# Patient Record
Sex: Male | Born: 1985 | Race: White | Hispanic: No | Marital: Single | Smoking: Never smoker
Health system: Southern US, Community
[De-identification: ages and names within clinical notes are randomized; demographics above are authoritative.]

## PROBLEM LIST (undated history)

## (undated) DIAGNOSIS — F419 Anxiety disorder, unspecified: Secondary | ICD-10-CM

---

## 2014-02-01 ENCOUNTER — Emergency Department: Payer: Self-pay | Admitting: Emergency Medicine

## 2015-10-03 ENCOUNTER — Encounter (HOSPITAL_COMMUNITY): Payer: Self-pay | Admitting: Emergency Medicine

## 2015-10-03 DIAGNOSIS — M25472 Effusion, left ankle: Secondary | ICD-10-CM | POA: Insufficient documentation

## 2015-10-03 DIAGNOSIS — Z79899 Other long term (current) drug therapy: Secondary | ICD-10-CM | POA: Insufficient documentation

## 2015-10-03 NOTE — ED Notes (Signed)
Pt. reports left ankle pain with swelling onset 2 days ago , denies recent injury or fall /ambulatory .

## 2015-10-04 ENCOUNTER — Emergency Department (HOSPITAL_COMMUNITY)
Admission: EM | Admit: 2015-10-04 | Discharge: 2015-10-04 | Disposition: A | Discharge status: Home / Self Care | Payer: Self-pay | Attending: Emergency Medicine | Admitting: Emergency Medicine

## 2015-10-04 ENCOUNTER — Emergency Department (HOSPITAL_COMMUNITY): Payer: Self-pay

## 2015-10-04 DIAGNOSIS — M25472 Effusion, left ankle: Secondary | ICD-10-CM

## 2015-10-04 MED ORDER — OXYCODONE-ACETAMINOPHEN 5-325 MG PO TABS
1.0000 | ORAL_TABLET | Freq: Once | ORAL | Status: AC
  Administered 2015-10-04: 1 via ORAL
  Filled 2015-10-04: qty 1

## 2015-10-04 MED ORDER — OXYCODONE-ACETAMINOPHEN 5-325 MG PO TABS: 1.0000 | ORAL_TABLET | ORAL | Status: AC | PRN

## 2015-10-04 NOTE — Discharge Instructions (Signed)
Ankle Sprain  An ankle sprain is an injury to the strong, fibrous tissues (ligaments) that hold the bones of your ankle joint together.   CAUSES  An ankle sprain is usually caused by a fall or by twisting your ankle. Ankle sprains most commonly occur when you step on the outer edge of your foot, and your ankle turns inward. People who participate in sports are more prone to these types of injuries.   SYMPTOMS    Pain in your ankle. The pain may be present at rest or only when you are trying to stand or walk.   Swelling.   Bruising. Bruising may develop immediately or within 1 to 2 days after your injury.   Difficulty standing or walking, particularly when turning corners or changing directions.  DIAGNOSIS   Your caregiver will ask you details about your injury and perform a physical exam of your ankle to determine if you have an ankle sprain. During the physical exam, your caregiver will press on and apply pressure to specific areas of your foot and ankle. Your caregiver will try to move your ankle in certain ways. An X-ray exam may be done to be sure a bone was not broken or a ligament did not separate from one of the bones in your ankle (avulsion fracture).   TREATMENT   Certain types of braces can help stabilize your ankle. Your caregiver can make a recommendation for this. Your caregiver may recommend the use of medicine for pain. If your sprain is severe, your caregiver may refer you to a surgeon who helps to restore function to parts of your skeletal system (orthopedist) or a physical therapist.  HOME CARE INSTRUCTIONS    Apply ice to your injury for 1-2 days or as directed by your caregiver. Applying ice helps to reduce inflammation and pain.    Put ice in a plastic bag.    Place a towel between your skin and the bag.    Leave the ice on for 15-20 minutes at a time, every 2 hours while you are awake.   Only take over-the-counter or prescription medicines for pain, discomfort, or fever as directed by  your caregiver.   Elevate your injured ankle above the level of your heart as much as possible for 2-3 days.   If your caregiver recommends crutches, use them as instructed. Gradually put weight on the affected ankle. Continue to use crutches or a cane until you can walk without feeling pain in your ankle.   If you have a plaster splint, wear the splint as directed by your caregiver. Do not rest it on anything harder than a pillow for the first 24 hours. Do not put weight on it. Do not get it wet. You may take it off to take a shower or bath.   You may have been given an elastic bandage to wear around your ankle to provide support. If the elastic bandage is too tight (you have numbness or tingling in your foot or your foot becomes cold and blue), adjust the bandage to make it comfortable.   If you have an air splint, you may blow more air into it or let air out to make it more comfortable. You may take your splint off at night and before taking a shower or bath. Wiggle your toes in the splint several times per day to decrease swelling.  SEEK MEDICAL CARE IF:    You have rapidly increasing bruising or swelling.   Your toes feel   extremely cold or you lose feeling in your foot.   Your pain is not relieved with medicine.  SEEK IMMEDIATE MEDICAL CARE IF:   Your toes are numb or blue.   You have severe pain that is increasing.  MAKE SURE YOU:    Understand these instructions.   Will watch your condition.   Will get help right away if you are not doing well or get worse.     This information is not intended to replace advice given to you by your health care provider. Make sure you discuss any questions you have with your health care provider.     Document Released: 03/21/2005 Document Revised: 04/11/2014 Document Reviewed: 04/02/2011  Elsevier Interactive Patient Education 2016 [employer]vier [employer]

## 2015-10-04 NOTE — ED Notes (Signed)
Pt left at this time with all belongings, refused wheelchair.  

## 2015-10-04 NOTE — ED Provider Notes (Signed)
CSN: [id number]     Arrival date & time 10/03/15  2335 History   First MD Initiated Contact with Patient 10/04/15 0242     Chief Complaint  Patient presents with  . Ankle Pain     (Consider location/radiation/quality/duration/timing/severity/associated sxs/prior Treatment) HPI   PCP: No primary care provider on file. Vincent Johnston male29 y.o. PMH: chronic ankle (left) injury  Patient to the ER for xray and pain medication for his left ankle. He reports injuring it significantly many years ago and that every once in a while he re- injures it and experiences acute pain. He believes he may have slept on it wrong because when he got out of bed this morning it gave out on him. 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   ROS: The patient denies confusion, diaphoresis,  headache, weakness (general or focal), confusion, change of vision, rash, neck pain, chest pain, shortness of breath,  back pain.     History reviewed. No pertinent past medical history. History reviewed. No pertinent past surgical history. No family history on file. Social History  Substance Use Topics  . Smoking status: Never Smoker   . Smokeless tobacco: None  . Alcohol Use: Yes    Review of Systems  Review of Systems All other systems negative except as documented in the HPI. All pertinent positives and negatives as reviewed in the HPI.   Allergies  Cefaclor; Cephalexin; Cephalosporins; Loracarbef; and Penicillins  Home Medications   Prior to Admission medications   Medication Sig Start Date End Date Taking? Authorizing Provider  clonazePAM (KLONOPIN) 1 MG tablet Take 0.5 mg by mouth 2 (two) times daily. 08/09/15  Yes Historical Provider, MD  ibuprofen (ADVIL,MOTRIN) 800 MG tablet Take 800 mg by mouth every 6 (six) hours as needed for moderate pain.   Yes Historical Provider, MD  PARoxetine (PAXIL) 20 MG tablet Take 20 mg by mouth daily. 07/07/15  Yes Historical  Provider, MD  prednisoLONE (ORAPRED) 15 MG/5ML solution Take 20 mg by mouth daily. 10/02/15  Yes Historical Provider, MD  oxyCODONE-acetaminophen (PERCOCET/ROXICET) 5-325 MG tablet Take 1 tablet by mouth every 4 (four) hours as needed for severe pain. 10/04/15   Vincent Omar Neva SeatGreene, PA-C   BP 138/106 mmHg  Pulse 73  Temp(Src) 98.5 F (36.9 C) (Oral)  Resp 16  Ht 5\' 10"  (1.778 m)  Wt 96.163 kg  BMI 30.42 kg/m2  SpO2 100% Physical Exam  Constitutional: He appears well-developed and well-nourished. No distress.  HENT:  Head: Normocephalic and atraumatic.  Eyes: Pupils are equal, round, and reactive to light.  Neck: Normal range of motion. Neck supple.  Cardiovascular: Normal rate and regular rhythm.   Pulmonary/Chest: Effort normal.  Abdominal: Soft.  Musculoskeletal:       Left ankle: He exhibits decreased range of motion (due to pain) and swelling. He exhibits no ecchymosis, no deformity, no laceration and normal pulse. Tenderness. Lateral malleolus and medial malleolus tenderness found. No AITFL tenderness found. Achilles tendon normal.  Neurological: He is alert.  Skin: Skin is warm and dry.  Nursing note and vitals reviewed.   ED Course  Procedures (including critical care time) Labs Review Labs Reviewed - No data to display  Imaging Review Dg Ankle Complete Left  10/04/2015  CLINICAL DATA:  Initial evaluation for acute ankle pain. EXAM: LEFT ANKLE COMPLETE - 3+ VIEW COMPARISON:  None. FINDINGS: No acute fracture or dislocation. Ankle mortise approximated. Talar dome intact. Probable small joint  effusion with mild soft tissue swelling. Osseous mineralization normal. IMPRESSION: 1. No acute fracture or dislocation. 2. Probable small joint effusion with mild diffuse soft tissue swelling. Electronically Signed   By: Rise MuBenjamin  McClintock M.D.   On: 10/04/2015 04:42   I have personally reviewed and evaluated these images and lab results as part of my medical decision-making.   EKG  Interpretation None      MDM   Final diagnoses:  Ankle effusion, left    Patient X-Ray negative for obvious fracture or dislocation.  Pt advised to follow up with orthopedics. Patient has brace at home already.  Conservative therapy recommended and discussed, given referral to Ortho. Patient will be discharged home & is agreeable with above plan. Returns precautions discussed. Pt appears safe for discharge.     Vincent Peliffany Norinne Jeane, PA-C 10/04/15 0510  Rolland PorterMark James, MD 10/16/15 [phone]

## 2015-10-15 ENCOUNTER — Ambulatory Visit (HOSPITAL_COMMUNITY)
Admission: EM | Admit: 2015-10-15 | Discharge: 2015-10-15 | Disposition: A | Discharge status: Home / Self Care | Payer: Self-pay | Attending: Family Medicine | Admitting: Family Medicine

## 2015-10-15 ENCOUNTER — Encounter (HOSPITAL_COMMUNITY): Payer: Self-pay | Admitting: *Deleted

## 2015-10-15 DIAGNOSIS — S93402S Sprain of unspecified ligament of left ankle, sequela: Secondary | ICD-10-CM

## 2015-10-15 NOTE — Discharge Instructions (Signed)
Ankle Sprain °An ankle sprain is an injury to the strong, fibrous tissues (ligaments) that hold your ankle bones together.  °HOME CARE  °· Put ice on your ankle for 1-2 days or as told by your doctor. °¨ Put ice in a plastic bag. °¨ Place a towel between your skin and the bag. °¨ Leave the ice on for 15-20 minutes at a time, every 2 hours while you are awake. °· Only take medicine as told by your doctor. °· Raise (elevate) your injured ankle above the level of your heart as much as possible for 2-3 days. °· Use crutches if your doctor tells you to. Slowly put your own weight on the affected ankle. Use the crutches until you can walk without pain. °· If you have a plaster splint: °¨ Do not rest it on anything harder than a pillow for 24 hours. °¨ Do not put weight on it. °¨ Do not get it wet. °¨ Take it off to shower or bathe. °· If given, use an elastic wrap or support stocking for support. Take the wrap off if your toes lose feeling (numb), tingle, or turn cold or blue. °· If you have an air splint: °¨ Add or let out air to make it comfortable. °¨ Take it off at night and to shower and bathe. °¨ Wiggle your toes and move your ankle up and down often while you are wearing it. °GET HELP IF: °· You have rapidly increasing bruising or puffiness (swelling). °· Your toes feel very cold. °· You lose feeling in your foot. °· Your medicine does not help your pain. °GET HELP RIGHT AWAY IF:  °· Your toes lose feeling (numb) or turn blue. °· You have severe pain that is increasing. °MAKE SURE YOU:  °· Understand these instructions. °· Will watch your condition. °· Will get help right away if you are not doing well or get worse. °  °This information is not intended to replace advice given to you by your health care provider. Make sure you discuss any questions you have with your health care provider. °  °Document Released: 09/07/2007 Document Revised: 04/11/2014 Document Reviewed: 10/03/2011 °Elsevier Interactive Patient  Education ©2016 Elsevier Inc. ° °

## 2015-10-15 NOTE — ED Provider Notes (Signed)
CSN: [id number]     Arrival date & time 10/15/15  1113 History   First MD Initiated Contact with Patient 10/15/15 1215     Chief Complaint  Patient presents with  . Ankle Pain   (Consider location/radiation/quality/duration/timing/severity/associated sxs/prior Treatment) HPI History obtained from patient:  Pt presents with the cc of:  Left ankle pain Duration of symptoms: 10-12 days Treatment prior to arrival: Was seen in the emergency department had x-rays done no fracture was seen and treated for ankle sprain. Context: Patient states that he sprained his left ankle getting out of bed on July 2, was seen in the emergency department had x-rays done and is been treating the ankle symptomatically states that there was a period of time it if felt better and then over the last day or so pain is gotten worse. He is requesting pain medicine at this time also. States that he tried to make an appointment with Dr. Magnus Ivan but was unable to do so on until later in July. Other symptoms include: Pain in standing and walking on left ankle foot Pain score: 4 or 5 FAMILY HISTORY: None    History reviewed. No pertinent past medical history. History reviewed. No pertinent past surgical history. History reviewed. No pertinent family history. Social History  Substance Use Topics  . Smoking status: Never Smoker   . Smokeless tobacco: None  . Alcohol Use: Yes    Review of Systems  Denies: HEADACHE, NAUSEA, ABDOMINAL PAIN, CHEST PAIN, CONGESTION, DYSURIA, SHORTNESS OF BREATH  Allergies  Cefaclor; Cephalexin; Cephalosporins; Loracarbef; and Penicillins  Home Medications   Prior to Admission medications   Medication Sig Start Date End Date Taking? Authorizing Provider  clonazePAM (KLONOPIN) 1 MG tablet Take 0.5 mg by mouth 2 (two) times daily. 08/09/15   Historical Provider, MD  ibuprofen (ADVIL,MOTRIN) 800 MG tablet Take 800 mg by mouth every 6 (six) hours as needed for moderate pain.    Historical  Provider, MD  oxyCODONE-acetaminophen (PERCOCET/ROXICET) 5-325 MG tablet Take 1 tablet by mouth every 4 (four) hours as needed for severe pain. 10/04/15   Tiffany Neva Seat, PA-C  PARoxetine (PAXIL) 20 MG tablet Take 20 mg by mouth daily. 07/07/15   Historical Provider, MD  prednisoLONE (ORAPRED) 15 MG/5ML solution Take 20 mg by mouth daily. 10/02/15   Historical Provider, MD   Meds Ordered and Administered this Visit  Medications - No data to display  BP 162/104 mmHg  Pulse 102  Temp(Src) 98.6 F (37 C) (Oral)  Resp 18  SpO2 100% No data found.   Physical Exam NURSES NOTES AND VITAL SIGNS REVIEWED. CONSTITUTIONAL: Well developed, well nourished, no acute distress HEENT: normocephalic, atraumatic EYES: Conjunctiva normal NECK:normal ROM, supple, no adenopathy PULMONARY:No respiratory distress, normal effort ABDOMINAL: Soft, ND, NT BS+, No CVAT MUSCULOSKELETAL: Normal ROM of all extremities, left ankle is without swelling patient states he continues to have tenderness on the medial aspect of the ankle. No bruising is noted. SKIN: warm and dry without rash PSYCHIATRIC: Mood and affect, behavior are normal  ED Course  Procedures (including critical care time)  Labs Review Labs Reviewed - No data to display  Imaging Review No results found.   Visual Acuity Review  Right Eye Distance:   Left Eye Distance:   Bilateral Distance:    Right Eye Near:   Left Eye Near:    Bilateral Near:      Cam Dan Humphreys is applied to the left foot and ankle. He is advised to continue using Tylenol  ibuprofen for discomfort.   MDM   1. Ankle sprain, left, sequela     Patient is reassured that there are no issues that require transfer to higher level of care at this time or additional tests. Patient is advised to continue home symptomatic treatment. Patient is advised that if there are new or worsening symptoms to attend the emergency department, contact primary care provider, or return to  [facility]. Instructions of care provided discharged home in stable condition.    THIS NOTE WAS GENERATED USING A VOICE RECOGNITION SOFTWARE PROGRAM. ALL REASONABLE EFFORTS  WERE MADE TO PROOFREAD THIS DOCUMENT FOR ACCURACY.  I have verbally reviewed the discharge instructions with the patient. A printed AVS was given to the patient.  All questions were answered prior to discharge.      Tharon AquasFrank C Naamah Boggess, PA 10/15/15 1354

## 2015-10-15 NOTE — ED Notes (Signed)
Pt  Noticed  On his   Discharge  Papers  An  Entry  On his  Chart   From a  Prior  Visit  Stating  He  Has  Taken  augmentin in past  But  Pt  States  He  Has  Not  And  Is  Unsure  Why it  Was  Charted

## 2015-10-15 NOTE — ED Notes (Signed)
Pt  Was  Seen    11  Days  Ago   At the    Er    For  An  Ankle  Sprain   He  Is  Here  For  followup   As  It  Had  Gotten  Better  But  Became  Worse      Over  last  Several days    He  Says  He  Stands  On his  Feet   At work  A  Lot

## 2017-05-28 IMAGING — CR DG ANKLE COMPLETE 3+V*L*
3 series · 3 of 3 positions shown · non-contrast
Comparison: None.

CLINICAL DATA: Initial evaluation for acute ankle pain.

EXAM:
LEFT ANKLE COMPLETE - 3+ VIEW

[ankle ap]
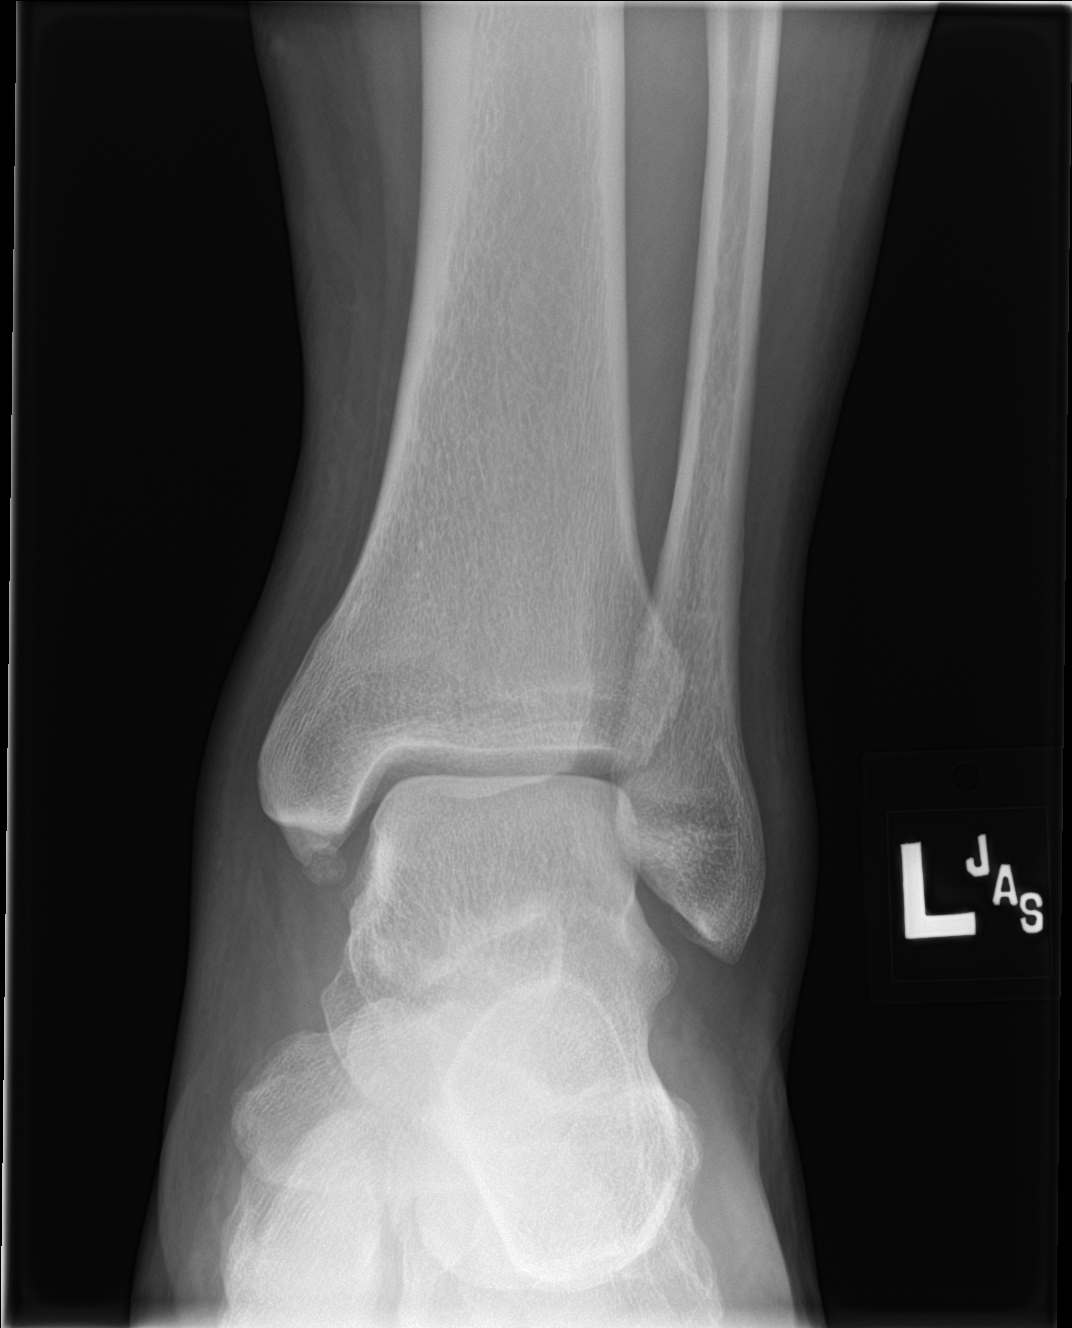

[ankle obl]
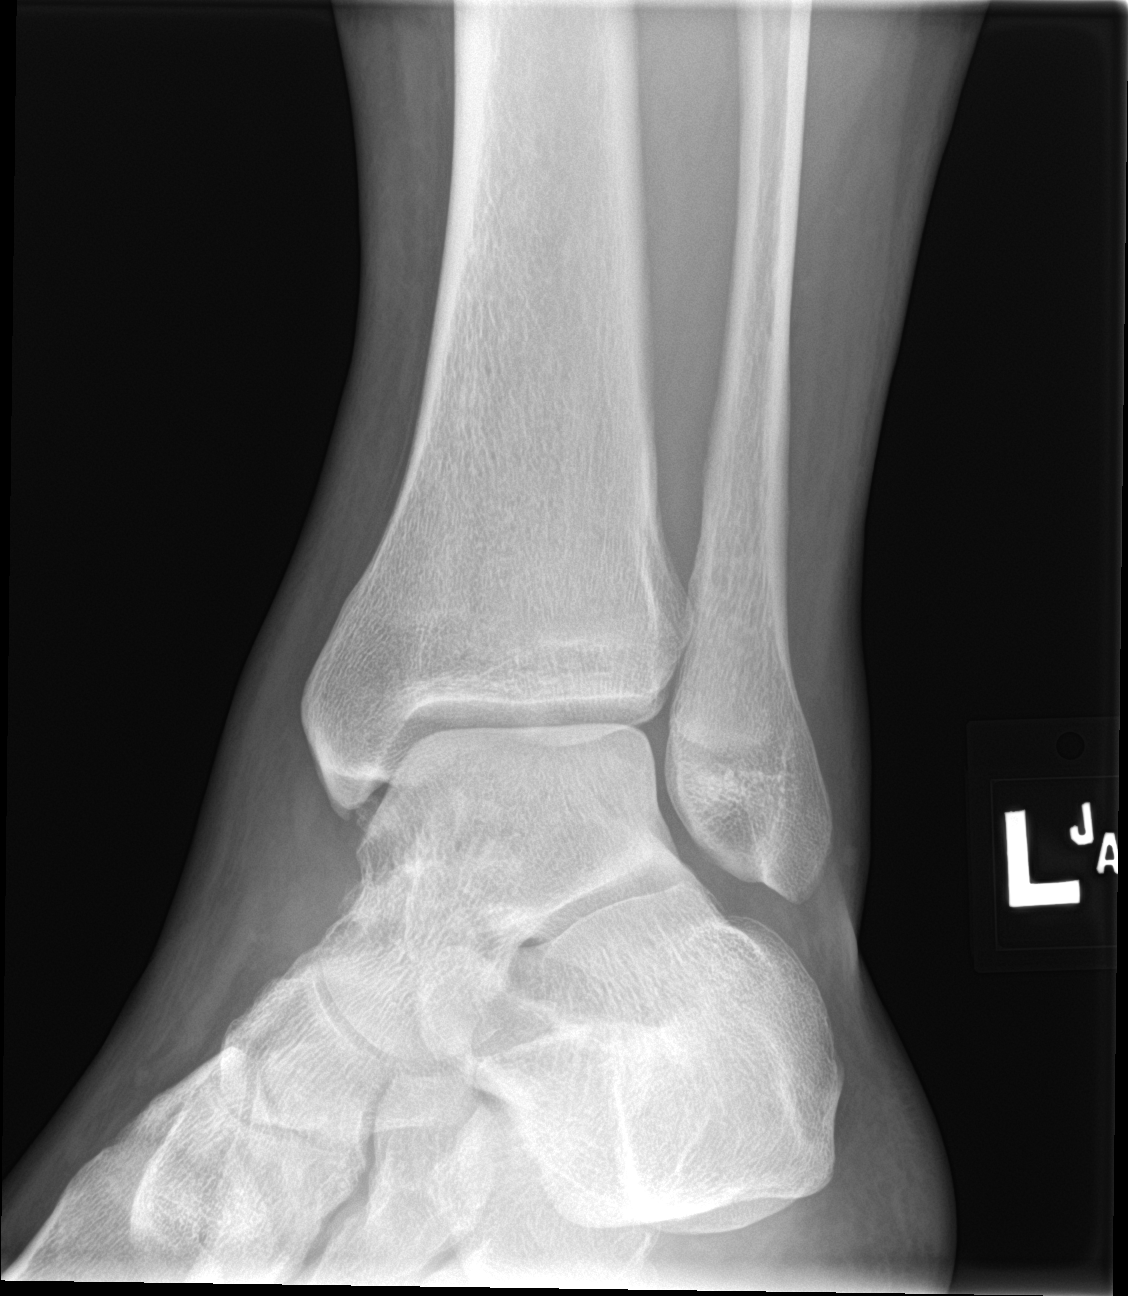

[ankle lat]
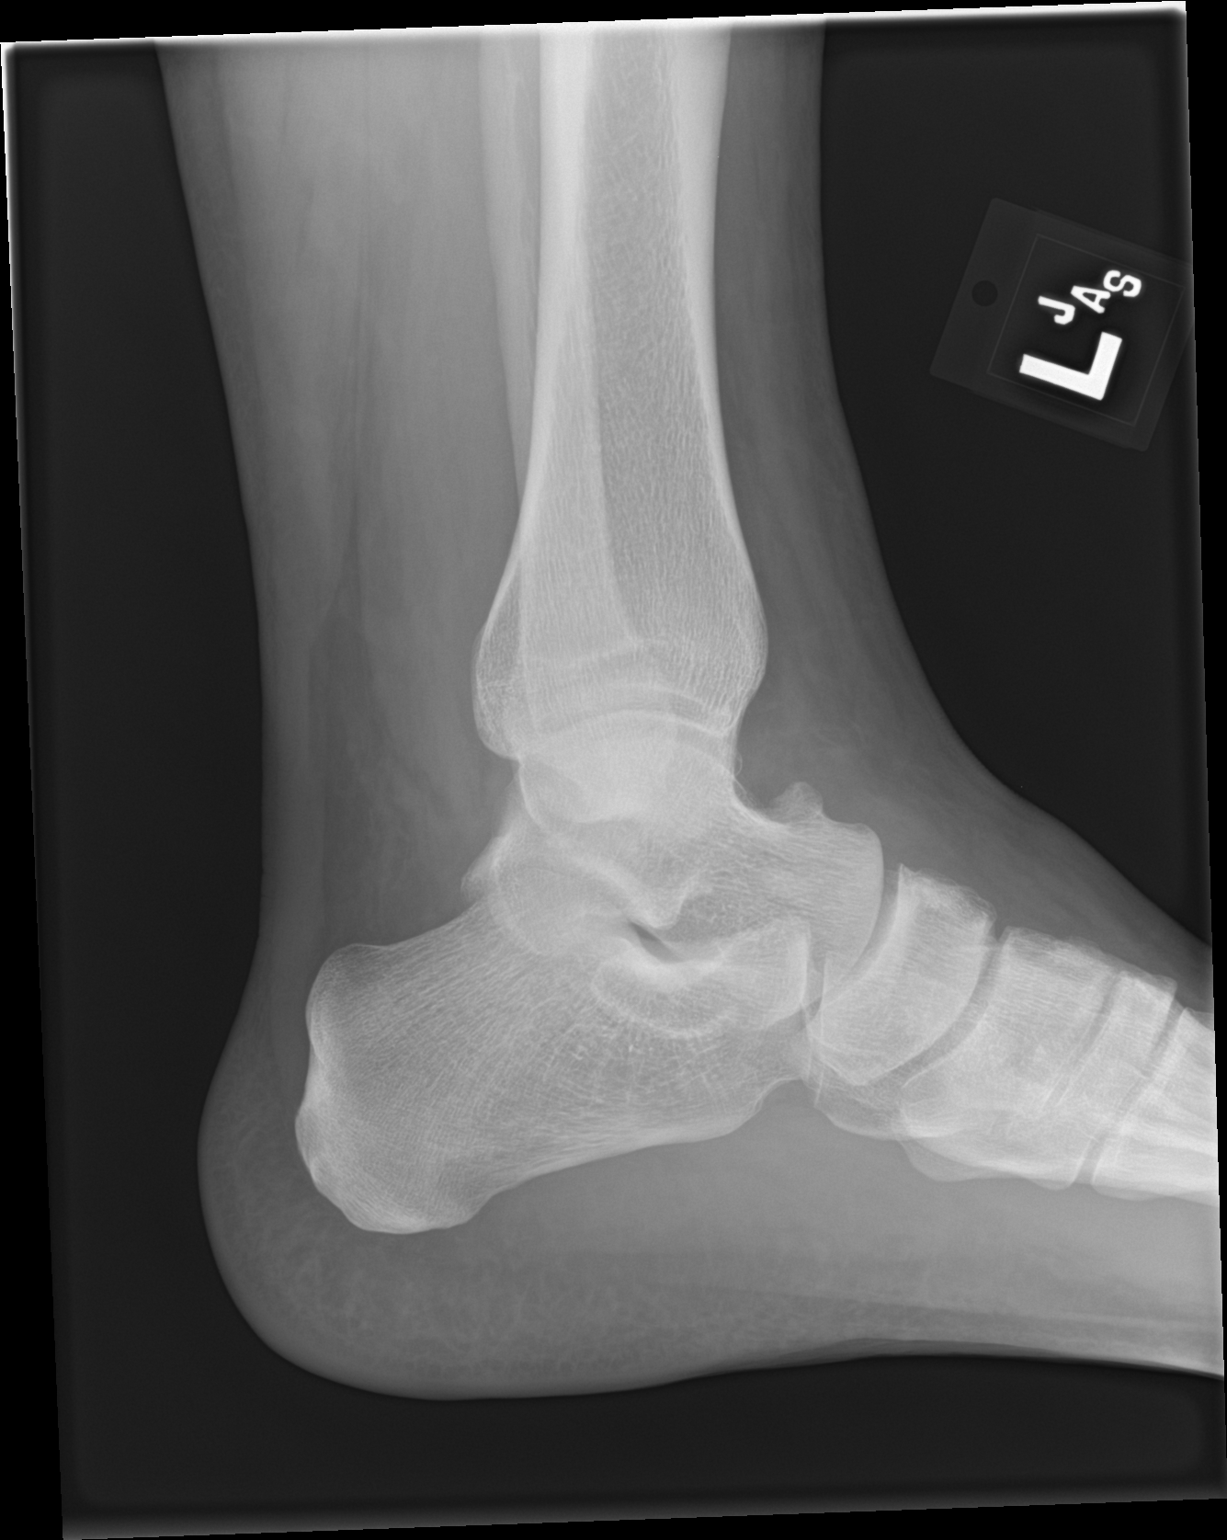

[3 of 3 positions shown; findings below may reference images not displayed]

FINDINGS: No acute fracture or dislocation. Ankle mortise approximated. Talar
dome intact. Probable small joint effusion with mild soft tissue
swelling. Osseous mineralization normal.
IMPRESSION: 1. No acute fracture or dislocation.
2. Probable small joint effusion with mild diffuse soft tissue
swelling.

## 2017-09-28 ENCOUNTER — Other Ambulatory Visit: Payer: Self-pay | Admitting: *Deleted

## 2017-09-28 ENCOUNTER — Ambulatory Visit
Admission: RE | Admit: 2017-09-28 | Discharge: 2017-09-28 | Disposition: A | Discharge status: Home / Self Care | Payer: No Typology Code available for payment source | Source: Ambulatory Visit | Attending: *Deleted | Admitting: *Deleted

## 2017-09-28 DIAGNOSIS — R7611 Nonspecific reaction to tuberculin skin test without active tuberculosis: Secondary | ICD-10-CM

## 2021-08-31 ENCOUNTER — Other Ambulatory Visit: Payer: Self-pay

## 2021-08-31 ENCOUNTER — Encounter: Payer: Self-pay | Admitting: Emergency Medicine

## 2021-08-31 ENCOUNTER — Ambulatory Visit
Admission: EM | Admit: 2021-08-31 | Discharge: 2021-08-31 | Disposition: A | Discharge status: Home / Self Care | Payer: 59 | Attending: Internal Medicine | Admitting: Internal Medicine

## 2021-08-31 ENCOUNTER — Emergency Department (HOSPITAL_COMMUNITY)
Admission: EM | Admit: 2021-08-31 | Discharge: 2021-08-31 | Discharge status: Against Medical Advice | Payer: No Typology Code available for payment source

## 2021-08-31 DIAGNOSIS — F410 Panic disorder [episodic paroxysmal anxiety] without agoraphobia: Secondary | ICD-10-CM | POA: Diagnosis not present

## 2021-08-31 DIAGNOSIS — R0789 Other chest pain: Secondary | ICD-10-CM | POA: Diagnosis not present

## 2021-08-31 HISTORY — DX: Anxiety disorder, unspecified: F41.9

## 2021-08-31 NOTE — ED Notes (Signed)
Patient left without being seen, states I am becoming more anxious while waiting and I am afraid I will have another panic attack.

## 2021-08-31 NOTE — Discharge Instructions (Addendum)
Please go to the ER if symptoms persist or worsen. Please go to provided address for behavioral health urgent care for further evaluation and management of anxiety.  Also recommend getting a primary care doctor for further evaluation management.

## 2021-08-31 NOTE — ED Triage Notes (Signed)
Pt here for episode of CP today that felt like anxiety with hx of same; pt went to ED but LWBS

## 2021-08-31 NOTE — ED Provider Notes (Signed)
[facility]    CSN: [id number] Arrival date & time: 08/31/21  1652      History   Chief Complaint Chief Complaint  Patient presents with   Anxiety   Chest Pain    HPI Vincent Johnston is a 36 y.o. male.   Patient presents for suspected anxiety panic attack that started at approximately 3 PM today.  Patient reports that he started feeling anxious, so he took his blood pressure which was 180 systolic.  This made him feel more anxious so he started having chest pain.  Due to this, he went to the emergency department to ensure that everything was okay and his heart was okay.  Patient left before being seen.  Patient presents to the urgent care today to have an EKG completed to ensure that his heart is okay.  He reports that his panic attack has basically resolved and is not having any chest pain at this time.  Denies any associated shortness of breath at this time as well.  Patient reports that he has been dealing with panic attacks for multiple years.  He took Klonopin as needed in the past as well as Paxil.  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  He currently takes Paxil 40 mg daily which is managed by a family member who is a Publishing rights manager.  He does not have a PCP.  He does attend therapy weekly as well.  Patient reports that he has "left ventricular problems" that was diagnosed approximately 15 years ago that was no concern at that time.  Denies any other pertinent cardiac history.  Patient used to have high blood pressure as well but does not currently take any blood pressure medication.   Anxiety  Chest Pain Associated symptoms: anxiety    Past Medical History:  Diagnosis Date   Anxiety     There are no problems to display for this patient.   History reviewed. No pertinent surgical history.     Home Medications    Prior to Admission medications   Medication Sig Start Date End Date  Taking? Authorizing Provider  clonazePAM (KLONOPIN) 1 MG tablet Take 0.5 mg by mouth 2 (two) times daily. 08/09/15   [provider]  ibuprofen (ADVIL,MOTRIN) 800 MG tablet Take 800 mg by mouth every 6 (six) hours as needed for moderate pain.    [provider]  oxyCODONE-acetaminophen (PERCOCET/ROXICET) 5-325 MG tablet Take 1 tablet by mouth every 4 (four) hours as needed for severe pain. 10/04/15   Marlon Pel, PA-C  PARoxetine (PAXIL) 20 MG tablet Take 20 mg by mouth daily. 07/07/15   [provider]  prednisoLONE (ORAPRED) 15 MG/5ML solution Take 20 mg by mouth daily. Patient not taking: Reported on 08/31/2021 10/02/15   [provider]    Family History History reviewed. No pertinent family history.  Social History Social History   Tobacco Use   Smoking status: Never  Substance Use Topics   Alcohol use: Yes   Drug use: No     Allergies   Cefaclor, Cephalexin, Cephalosporins, Loracarbef, and Penicillins   Review of Systems Review of Systems Per HPI  Physical Exam Triage Vital Signs ED Triage Vitals [08/31/21 1813]  Enc Vitals Group     BP (!) 155/111     Pulse Rate 92     Resp 18     Temp 98.2 F (36.8 C)     Temp Source Oral  SpO2 96 %     Weight      Height      Head Circumference      Peak Flow      Pain Score 3     Pain Loc      Pain Edu?      Excl. in GC?    No data found.  Updated Vital Signs BP (!) 155/111 (BP Location: Left Arm)   Pulse 92   Temp 98.2 F (36.8 C) (Oral)   Resp 18   SpO2 96%   Visual Acuity Right Eye Distance:   Left Eye Distance:   Bilateral Distance:    Right Eye Near:   Left Eye Near:    Bilateral Near:     Physical Exam Constitutional:      General: He is not in acute distress.    Appearance: Normal appearance. He is not toxic-appearing or diaphoretic.  HENT:     Head: Normocephalic and atraumatic.  Eyes:     Extraocular Movements: Extraocular movements intact.      Conjunctiva/sclera: Conjunctivae normal.     Pupils: Pupils are equal, round, and reactive to light.  Cardiovascular:     Rate and Rhythm: Normal rate and regular rhythm.     Pulses: Normal pulses.     Heart sounds: Normal heart sounds.  Pulmonary:     Effort: Pulmonary effort is normal.     Breath sounds: Normal breath sounds.  Neurological:     General: No focal deficit present.     Mental Status: He is alert and oriented to person, place, and time. Mental status is at baseline.     Cranial Nerves: Cranial nerves 2-12 are intact.     Sensory: Sensation is intact.     Motor: Motor function is intact.     Coordination: Coordination is intact.     Gait: Gait is intact.  Psychiatric:        Mood and Affect: Mood normal.        Behavior: Behavior normal.        Thought Content: Thought content normal.        Judgment: Judgment normal.     UC Treatments / Results  Labs (all labs ordered are listed, but only abnormal results are displayed) Labs Reviewed - No data to display  EKG   Radiology No results found.  Procedures Procedures (including critical care time)  Medications Ordered in UC Medications - No data to display  Initial Impression / Assessment and Plan / UC Course  I have reviewed the triage vital signs and the nursing notes.  Pertinent labs & imaging results that were available during my care of the patient were reviewed by me and considered in my medical decision making (see chart for details).     EKG was unremarkable.  Patient appears stable on physical exam.  Patient was advised that there are limited resources here at the urgent care to completely rule out cardiac etiology and that going back to the emergency department is highly recommended for further evaluation and management.  Patient stated that he "just wanted to go home and rest".  Patient declined going to the hospital.  Patient was also offered a chest x-ray but declined.  Risks associated with not  going to hospital were discussed with patient.  Patient voiced understanding.  Patient was then advised of the behavioral health urgent care that is available resource.  Patient states that he does not want to go anywhere else  tonight and wishes to go to the urgent care tomorrow if provided with address.  He was provided with address.  Patient was stable at discharge and did not have any current symptoms.  Vital signs appear stable and patient was advised to monitor blood pressure at home and to follow-up with a primary care doctor for further evaluation and management of this.  He was given strict return and ER precautions.  Patient verbalized understanding and was agreeable with plan. Final Clinical Impressions(s) / UC Diagnoses   Final diagnoses:  Panic attack  Other chest pain     Discharge Instructions      Please go to the ER if symptoms persist or worsen. Please go to provided address for behavioral health urgent care for further evaluation and management of anxiety.  Also recommend getting a primary care doctor for further evaluation management.    ED Prescriptions   None    PDMP not reviewed this encounter.   Gustavus BryantMound, Karena Kinker E, [place]FNP 08/31/21 [phone]

## 2023-06-01 ENCOUNTER — Ambulatory Visit: Attending: Otolaryngology

## 2023-06-01 DIAGNOSIS — G4733 Obstructive sleep apnea (adult) (pediatric): Secondary | ICD-10-CM | POA: Insufficient documentation

## 2024-04-12 ENCOUNTER — Other Ambulatory Visit (HOSPITAL_COMMUNITY): Payer: Self-pay
